# Patient Record
Sex: Male | Born: 1960 | Race: White | Hispanic: No | Marital: Married | Smoking: Never smoker
Health system: Southern US, Community
[De-identification: ages and names within clinical notes are randomized; demographics above are authoritative.]

---

## 2003-01-12 ENCOUNTER — Ambulatory Visit (HOSPITAL_COMMUNITY): Admission: RE | Admit: 2003-01-12 | Discharge: 2003-01-12 | Payer: Self-pay | Admitting: Gastroenterology

## 2003-03-30 ENCOUNTER — Ambulatory Visit (HOSPITAL_COMMUNITY): Admission: RE | Admit: 2003-03-30 | Discharge: 2003-03-30 | Payer: Self-pay | Admitting: General Surgery

## 2005-12-04 ENCOUNTER — Encounter: Admission: RE | Admit: 2005-12-04 | Discharge: 2005-12-04 | Payer: Self-pay | Admitting: Family Medicine

## 2008-12-08 ENCOUNTER — Ambulatory Visit: Payer: Self-pay | Admitting: Otolaryngology

## 2012-03-09 ENCOUNTER — Other Ambulatory Visit: Payer: Self-pay | Admitting: Advanced Practice Midwife

## 2012-03-09 DIAGNOSIS — R200 Anesthesia of skin: Secondary | ICD-10-CM

## 2012-03-09 DIAGNOSIS — M545 Low back pain: Secondary | ICD-10-CM

## 2012-03-12 ENCOUNTER — Inpatient Hospital Stay: Admission: RE | Admit: 2012-03-12 | Payer: Self-pay | Source: Ambulatory Visit

## 2012-07-05 ENCOUNTER — Ambulatory Visit
Admission: RE | Admit: 2012-07-05 | Discharge: 2012-07-05 | Disposition: A | Discharge status: Home / Self Care | Payer: Managed Care, Other (non HMO) | Source: Ambulatory Visit | Attending: Advanced Practice Midwife | Admitting: Advanced Practice Midwife

## 2012-07-05 DIAGNOSIS — M545 Low back pain: Secondary | ICD-10-CM

## 2012-07-05 DIAGNOSIS — R200 Anesthesia of skin: Secondary | ICD-10-CM

## 2014-08-23 ENCOUNTER — Ambulatory Visit: Payer: Managed Care, Other (non HMO) | Admitting: Sports Medicine

## 2014-10-17 ENCOUNTER — Encounter: Payer: Self-pay | Admitting: Sports Medicine

## 2014-10-17 ENCOUNTER — Ambulatory Visit
Admission: RE | Admit: 2014-10-17 | Discharge: 2014-10-17 | Disposition: A | Discharge status: Home / Self Care | Payer: Managed Care, Other (non HMO) | Source: Ambulatory Visit | Attending: Sports Medicine | Admitting: Sports Medicine

## 2014-10-17 ENCOUNTER — Ambulatory Visit (INDEPENDENT_AMBULATORY_CARE_PROVIDER_SITE_OTHER): Payer: Managed Care, Other (non HMO) | Admitting: Sports Medicine

## 2014-10-17 VITALS — BP 109/74 | HR 54 | Ht 71.0 in | Wt 195.0 lb

## 2014-10-17 DIAGNOSIS — M25552 Pain in left hip: Secondary | ICD-10-CM

## 2014-10-17 NOTE — Progress Notes (Signed)
   Subjective:    Patient ID: Michael Daniels, male    DOB: 05/31/1961, 54 y.o.   MRN: [id number]  HPI Michael Daniels is a 54 year old male who presents with a 6 month history of left hip pain. Michael Daniels first noticed the pain around 6 months ago. The pain is concentrated on the lateral side of his left hip. The pain only occurs when he flexes and adducts his hip (crosses his legs). The pain is a dull ache and has not limited his activity. He is still jogging and walking without pain. He has no pain during running only afterwards. He has not tried anything for his discomfort. Of note, approximately 4 years ago while backpacking in [place], Michael Daniels developed a sharp pain down the lateral aspect of his left leg. He had to drop his pack immediately. When he returned to the [place], he had an MRI that showed only mild degenerative changes in his back. Currently, he still has constant numbness over the lateral side of his thigh to his knee that is worsened while standing. He believes the numbness on the side of his leg is unrelated to his current complaint.    Review of Systems     Objective:   Physical Exam Well developed. Well nourished. No acute distress.  BP 109/74 mmHg  Pulse 54  Ht 5\' 11"  (1.803 m)  Wt 195 lb (88.451 kg)  BMI 27.21 kg/m2  Left leg: No swelling or atrophy noted. Non-tender to palpation at greater trochanter. Normal hip ROM. No pain with internal or external hip rotation. 5/5 strength with knee flexion, knee extension and hip abduction. 4/5 strength hip flexion secondary to pain.  Negative faber test.  + FADIR test;  Lacks 10 deg on left with knee to chest hip flexion.  Right leg: No swelling or atrophy noted. Non-tender to palpation. Normal hip ROM. 5/5 strength with knee flexion, knee extension, hip abduction and hip flexion.  Negative faber test  Small nodule palpated over left SI joint.    Ultrasound: Ultrasound of hip demonstrates bone spur on anterior aspect of hip.  Possible tear in superior labrum;  Possible loose body at the superior femoral acetabular interface.  On posterior view question of hip effusion - small.  XRay:  There is bilat spurring of acetabulum.  On left there is some loss of joint space superior and inferior.  Possible loose body.  Question pistol handle grip shape of Femoral head consistent with FAI.       Assessment & Plan:    [facility]. Note reviewed/edited by Dr. Enid BaasKarl Javier Gell.

## 2014-10-17 NOTE — Assessment & Plan Note (Addendum)
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   Plan: 1) X-ray to evaluate for arthritis in the hip. Will call patient with results then proceed from there about whether to refer to a hip surgeon. See results of hip XRAY  In note that is consistent with FAI.  Await formal reading.  We may need to follow up with additional testing but in any case could try conservative approach.  Running seems to cause most pain.

## 2014-11-21 ENCOUNTER — Ambulatory Visit: Payer: Managed Care, Other (non HMO) | Admitting: Sports Medicine

## 2016-04-24 ENCOUNTER — Ambulatory Visit: Payer: Managed Care, Other (non HMO) | Admitting: Sports Medicine

## 2016-05-01 ENCOUNTER — Ambulatory Visit: Payer: Managed Care, Other (non HMO) | Admitting: Sports Medicine

## 2017-01-20 IMAGING — CR DG HIP (WITH OR WITHOUT PELVIS) 2-3V*L*
2 series · 2 of 2 positions shown · non-contrast
Comparison: None.

ADDENDUM:
The original report was by Dr. Levar Forlife. The following addendum
is by Dr. Babji Alkaabi:

Dr. Gelso Kane question through the [REDACTED] EMR system for a second
opinion on this case particularly with respect to femoroacetabular
impingement.
Aspherical appearance at the junction of the femoral head and neck
bilaterally does represent a compelling appearance for cam-type
femoroacetabular impingement. There is overhanging spurring of the
right acetabulum which may also reflect associated pincer type MUTAIRI.
Mild craniocaudad loss of articular space, right greater than left.
CLINICAL DATA: Left hip pain for 3-4 months, no known injury,
initial encounter
EXAM:
DG HIP W/ PELVIS 2-3V*L*

[view not recorded (1 of 2)]
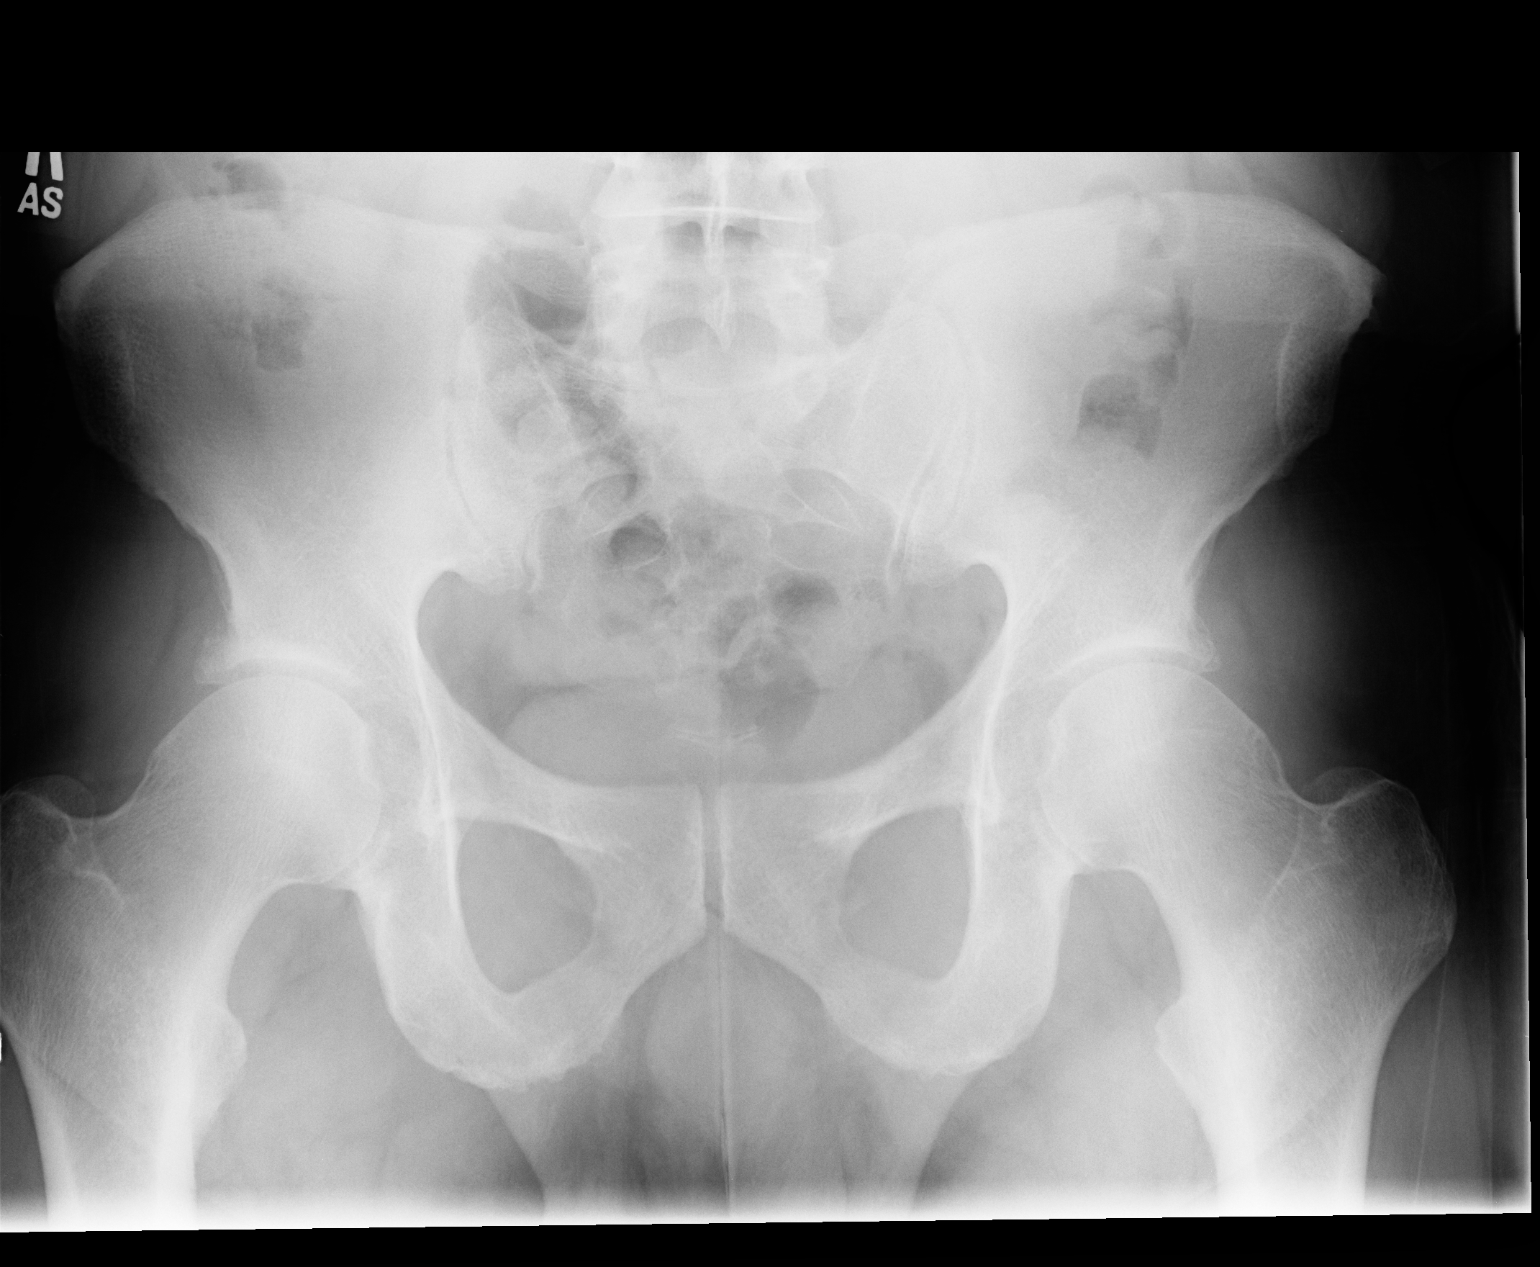

[view not recorded (2 of 2)]
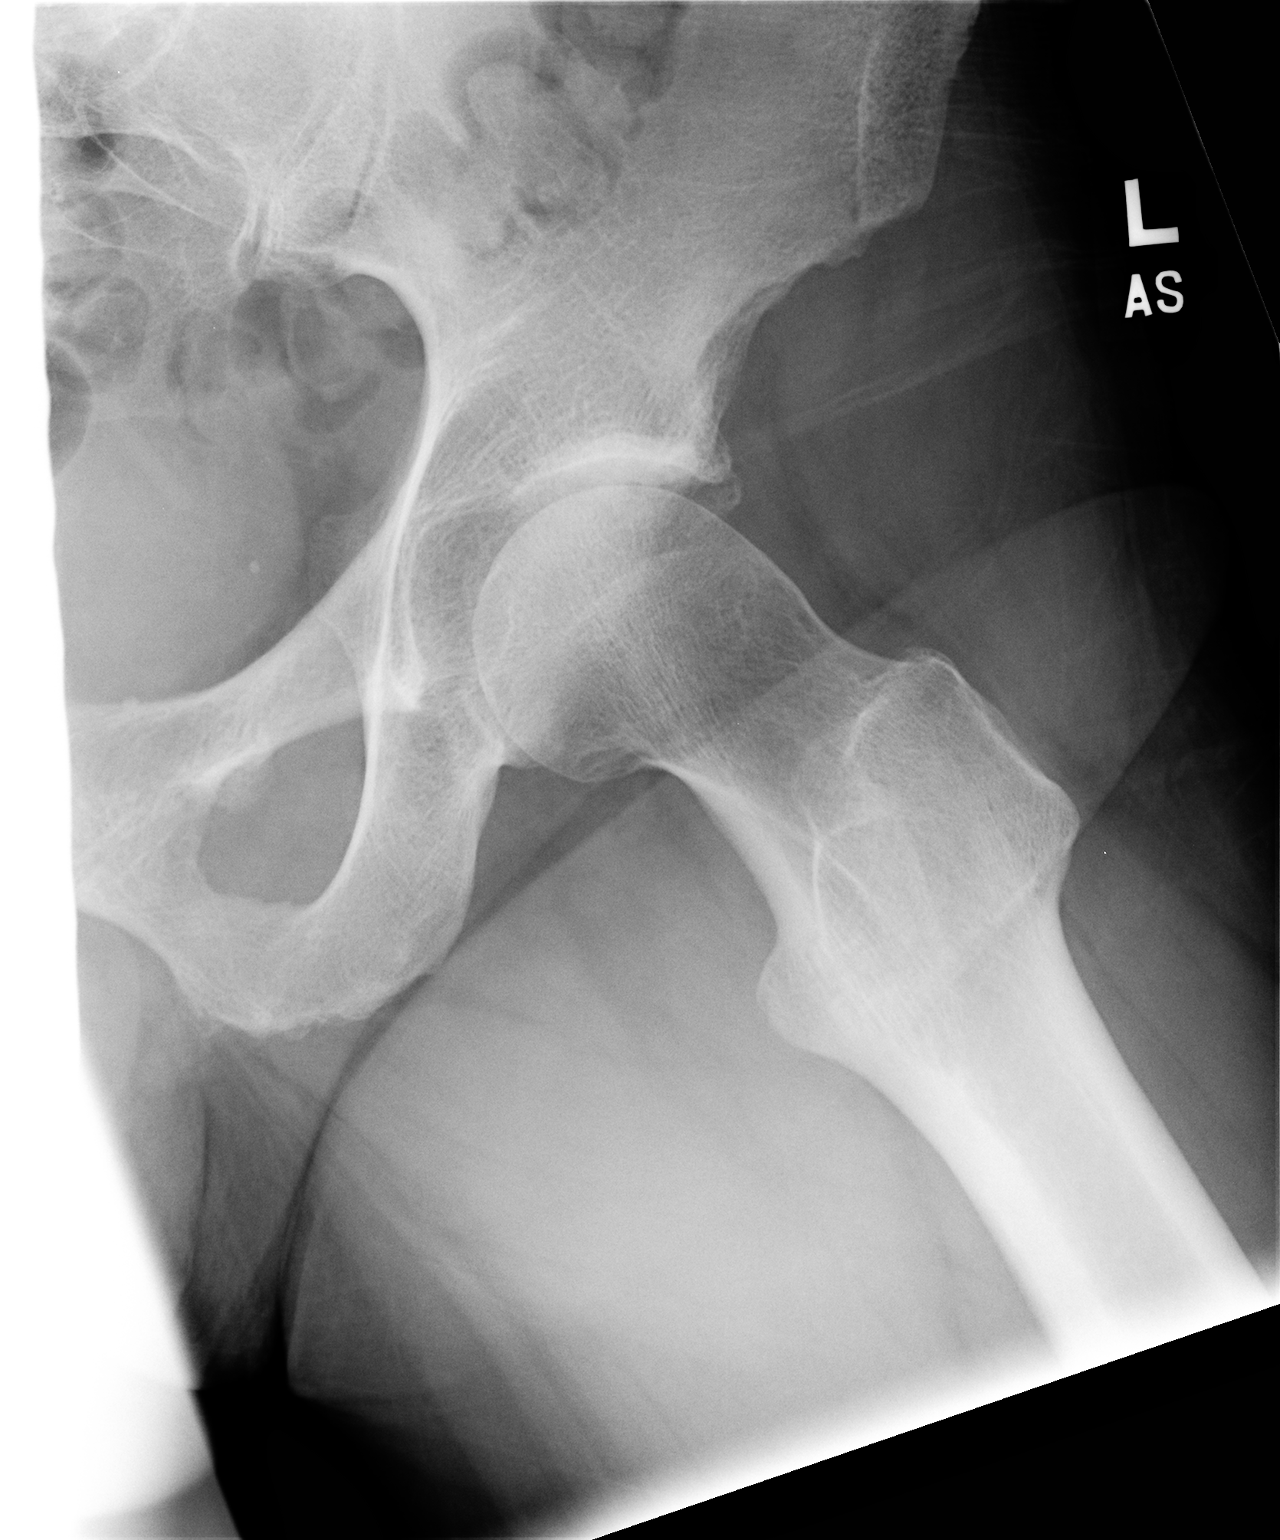

[2 of 2 positions shown; findings below may reference images not displayed]

FINDINGS: Mild degenerative changes of the hip joints are noted bilaterally.
The pelvic ring is intact. No gross soft tissue abnormality is
noted. No acute fracture is seen.
IMPRESSION: Degenerative changes without acute abnormality.

## 2017-12-03 ENCOUNTER — Ambulatory Visit: Payer: Managed Care, Other (non HMO) | Admitting: Sports Medicine

## 2019-06-30 ENCOUNTER — Other Ambulatory Visit: Payer: Self-pay

## 2019-06-30 DIAGNOSIS — Z20822 Contact with and (suspected) exposure to covid-19: Secondary | ICD-10-CM

## 2019-07-01 LAB — NOVEL CORONAVIRUS, NAA: SARS-CoV-2, NAA: NOT DETECTED

## 2019-09-27 DIAGNOSIS — M7702 Medial epicondylitis, left elbow: Secondary | ICD-10-CM | POA: Diagnosis not present

## 2019-09-27 DIAGNOSIS — M7712 Lateral epicondylitis, left elbow: Secondary | ICD-10-CM | POA: Diagnosis not present

## 2019-09-29 DIAGNOSIS — M7702 Medial epicondylitis, left elbow: Secondary | ICD-10-CM | POA: Diagnosis not present

## 2019-09-29 DIAGNOSIS — M7712 Lateral epicondylitis, left elbow: Secondary | ICD-10-CM | POA: Diagnosis not present

## 2019-10-03 DIAGNOSIS — M7702 Medial epicondylitis, left elbow: Secondary | ICD-10-CM | POA: Diagnosis not present

## 2019-10-03 DIAGNOSIS — M7712 Lateral epicondylitis, left elbow: Secondary | ICD-10-CM | POA: Diagnosis not present

## 2019-10-10 DIAGNOSIS — M7712 Lateral epicondylitis, left elbow: Secondary | ICD-10-CM | POA: Diagnosis not present

## 2019-10-10 DIAGNOSIS — M7702 Medial epicondylitis, left elbow: Secondary | ICD-10-CM | POA: Diagnosis not present

## 2019-11-11 DIAGNOSIS — M7712 Lateral epicondylitis, left elbow: Secondary | ICD-10-CM | POA: Diagnosis not present

## 2020-01-02 DIAGNOSIS — M7712 Lateral epicondylitis, left elbow: Secondary | ICD-10-CM | POA: Diagnosis not present

## 2020-01-13 DIAGNOSIS — L700 Acne vulgaris: Secondary | ICD-10-CM | POA: Diagnosis not present

## 2020-01-13 DIAGNOSIS — L810 Postinflammatory hyperpigmentation: Secondary | ICD-10-CM | POA: Diagnosis not present

## 2020-05-10 DIAGNOSIS — M7712 Lateral epicondylitis, left elbow: Secondary | ICD-10-CM | POA: Diagnosis not present

## 2020-06-07 DIAGNOSIS — H04123 Dry eye syndrome of bilateral lacrimal glands: Secondary | ICD-10-CM | POA: Diagnosis not present

## 2020-07-17 DIAGNOSIS — R590 Localized enlarged lymph nodes: Secondary | ICD-10-CM | POA: Diagnosis not present

## 2020-07-17 DIAGNOSIS — K644 Residual hemorrhoidal skin tags: Secondary | ICD-10-CM | POA: Diagnosis not present

## 2020-07-30 DIAGNOSIS — L739 Follicular disorder, unspecified: Secondary | ICD-10-CM | POA: Diagnosis not present

## 2020-07-30 DIAGNOSIS — Z23 Encounter for immunization: Secondary | ICD-10-CM | POA: Diagnosis not present

## 2020-07-31 DIAGNOSIS — M7712 Lateral epicondylitis, left elbow: Secondary | ICD-10-CM | POA: Diagnosis not present

## 2020-08-13 DIAGNOSIS — M25522 Pain in left elbow: Secondary | ICD-10-CM | POA: Diagnosis not present

## 2020-08-28 DIAGNOSIS — M7712 Lateral epicondylitis, left elbow: Secondary | ICD-10-CM | POA: Diagnosis not present

## 2020-08-30 DIAGNOSIS — K649 Unspecified hemorrhoids: Secondary | ICD-10-CM | POA: Diagnosis not present

## 2020-09-13 DIAGNOSIS — Z13220 Encounter for screening for lipoid disorders: Secondary | ICD-10-CM | POA: Diagnosis not present

## 2020-09-13 DIAGNOSIS — Z79899 Other long term (current) drug therapy: Secondary | ICD-10-CM | POA: Diagnosis not present

## 2020-09-13 DIAGNOSIS — Z0000 Encounter for general adult medical examination without abnormal findings: Secondary | ICD-10-CM | POA: Diagnosis not present

## 2020-11-02 DIAGNOSIS — K602 Anal fissure, unspecified: Secondary | ICD-10-CM | POA: Diagnosis not present

## 2020-11-02 DIAGNOSIS — L662 Folliculitis decalvans: Secondary | ICD-10-CM | POA: Diagnosis not present

## 2020-11-02 DIAGNOSIS — L219 Seborrheic dermatitis, unspecified: Secondary | ICD-10-CM | POA: Diagnosis not present

## 2020-11-02 DIAGNOSIS — L821 Other seborrheic keratosis: Secondary | ICD-10-CM | POA: Diagnosis not present

## 2020-12-24 DIAGNOSIS — L0889 Other specified local infections of the skin and subcutaneous tissue: Secondary | ICD-10-CM | POA: Diagnosis not present

## 2020-12-24 DIAGNOSIS — L739 Follicular disorder, unspecified: Secondary | ICD-10-CM | POA: Diagnosis not present

## 2020-12-24 DIAGNOSIS — L738 Other specified follicular disorders: Secondary | ICD-10-CM | POA: Diagnosis not present

## 2021-02-13 DIAGNOSIS — J309 Allergic rhinitis, unspecified: Secondary | ICD-10-CM | POA: Diagnosis not present

## 2021-03-08 DIAGNOSIS — M1711 Unilateral primary osteoarthritis, right knee: Secondary | ICD-10-CM | POA: Diagnosis not present

## 2021-03-08 DIAGNOSIS — M25561 Pain in right knee: Secondary | ICD-10-CM | POA: Diagnosis not present

## 2021-04-23 ENCOUNTER — Ambulatory Visit: Payer: 59 | Admitting: Sports Medicine

## 2021-05-23 ENCOUNTER — Ambulatory Visit: Payer: Self-pay | Admitting: Sports Medicine

## 2021-07-22 DIAGNOSIS — U071 COVID-19: Secondary | ICD-10-CM | POA: Diagnosis not present

## 2021-09-11 DIAGNOSIS — K219 Gastro-esophageal reflux disease without esophagitis: Secondary | ICD-10-CM | POA: Diagnosis not present

## 2021-09-12 DIAGNOSIS — K219 Gastro-esophageal reflux disease without esophagitis: Secondary | ICD-10-CM | POA: Diagnosis not present

## 2021-09-12 DIAGNOSIS — R1013 Epigastric pain: Secondary | ICD-10-CM | POA: Diagnosis not present

## 2021-09-12 DIAGNOSIS — R1310 Dysphagia, unspecified: Secondary | ICD-10-CM | POA: Diagnosis not present

## 2021-09-13 DIAGNOSIS — R1310 Dysphagia, unspecified: Secondary | ICD-10-CM | POA: Diagnosis not present

## 2021-09-13 DIAGNOSIS — R1013 Epigastric pain: Secondary | ICD-10-CM | POA: Diagnosis not present

## 2021-09-22 DIAGNOSIS — J019 Acute sinusitis, unspecified: Secondary | ICD-10-CM | POA: Diagnosis not present

## 2021-10-04 DIAGNOSIS — M5386 Other specified dorsopathies, lumbar region: Secondary | ICD-10-CM | POA: Diagnosis not present

## 2021-10-04 DIAGNOSIS — M9902 Segmental and somatic dysfunction of thoracic region: Secondary | ICD-10-CM | POA: Diagnosis not present

## 2021-10-04 DIAGNOSIS — M9904 Segmental and somatic dysfunction of sacral region: Secondary | ICD-10-CM | POA: Diagnosis not present

## 2021-10-04 DIAGNOSIS — M9903 Segmental and somatic dysfunction of lumbar region: Secondary | ICD-10-CM | POA: Diagnosis not present

## 2021-10-08 DIAGNOSIS — M9903 Segmental and somatic dysfunction of lumbar region: Secondary | ICD-10-CM | POA: Diagnosis not present

## 2021-10-08 DIAGNOSIS — M5386 Other specified dorsopathies, lumbar region: Secondary | ICD-10-CM | POA: Diagnosis not present

## 2021-10-08 DIAGNOSIS — M9902 Segmental and somatic dysfunction of thoracic region: Secondary | ICD-10-CM | POA: Diagnosis not present

## 2021-10-08 DIAGNOSIS — M9904 Segmental and somatic dysfunction of sacral region: Secondary | ICD-10-CM | POA: Diagnosis not present

## 2021-10-14 DIAGNOSIS — Z0000 Encounter for general adult medical examination without abnormal findings: Secondary | ICD-10-CM | POA: Diagnosis not present

## 2021-10-14 DIAGNOSIS — E7800 Pure hypercholesterolemia, unspecified: Secondary | ICD-10-CM | POA: Diagnosis not present

## 2021-10-14 DIAGNOSIS — Z125 Encounter for screening for malignant neoplasm of prostate: Secondary | ICD-10-CM | POA: Diagnosis not present

## 2021-10-14 DIAGNOSIS — Z79899 Other long term (current) drug therapy: Secondary | ICD-10-CM | POA: Diagnosis not present

## 2021-10-14 DIAGNOSIS — H6982 Other specified disorders of Eustachian tube, left ear: Secondary | ICD-10-CM | POA: Diagnosis not present

## 2021-10-14 DIAGNOSIS — H9312 Tinnitus, left ear: Secondary | ICD-10-CM | POA: Diagnosis not present

## 2021-10-14 DIAGNOSIS — R5383 Other fatigue: Secondary | ICD-10-CM | POA: Diagnosis not present

## 2021-11-14 DIAGNOSIS — K219 Gastro-esophageal reflux disease without esophagitis: Secondary | ICD-10-CM | POA: Diagnosis not present

## 2021-11-14 DIAGNOSIS — Z86010 Personal history of colonic polyps: Secondary | ICD-10-CM | POA: Diagnosis not present

## 2021-11-14 DIAGNOSIS — Z800 Family history of malignant neoplasm of digestive organs: Secondary | ICD-10-CM | POA: Diagnosis not present

## 2021-11-26 DIAGNOSIS — L738 Other specified follicular disorders: Secondary | ICD-10-CM | POA: Diagnosis not present

## 2021-11-26 DIAGNOSIS — L821 Other seborrheic keratosis: Secondary | ICD-10-CM | POA: Diagnosis not present

## 2021-11-26 DIAGNOSIS — H9012 Conductive hearing loss, unilateral, left ear, with unrestricted hearing on the contralateral side: Secondary | ICD-10-CM | POA: Diagnosis not present

## 2021-11-26 DIAGNOSIS — L0889 Other specified local infections of the skin and subcutaneous tissue: Secondary | ICD-10-CM | POA: Diagnosis not present

## 2022-02-21 DIAGNOSIS — D122 Benign neoplasm of ascending colon: Secondary | ICD-10-CM | POA: Diagnosis not present

## 2022-02-21 DIAGNOSIS — Z86010 Personal history of colonic polyps: Secondary | ICD-10-CM | POA: Diagnosis not present

## 2022-04-28 DIAGNOSIS — L738 Other specified follicular disorders: Secondary | ICD-10-CM | POA: Diagnosis not present

## 2022-04-28 DIAGNOSIS — L218 Other seborrheic dermatitis: Secondary | ICD-10-CM | POA: Diagnosis not present

## 2022-04-28 DIAGNOSIS — L820 Inflamed seborrheic keratosis: Secondary | ICD-10-CM | POA: Diagnosis not present

## 2022-08-12 ENCOUNTER — Ambulatory Visit: Payer: BC Managed Care – PPO | Admitting: Sports Medicine

## 2022-08-12 VITALS — BP 126/84 | Ht 71.0 in | Wt 195.0 lb

## 2022-08-12 DIAGNOSIS — M7671 Peroneal tendinitis, right leg: Secondary | ICD-10-CM

## 2022-08-12 MED ORDER — NITROGLYCERIN 0.2 MG/HR TD PT24: MEDICATED_PATCH | TRANSDERMAL | 1 refills | Status: AC

## 2022-08-12 NOTE — Assessment & Plan Note (Signed)
Conservative care Eversion exercise  Arch strap  NTG protocol

## 2022-08-12 NOTE — Progress Notes (Signed)
  SUBJECTIVE:   CHIEF COMPLAINT / HPI:   Mr. Michael Daniels is a 61 yo who presents with 5-week history of right foot pain.  He works as an Buyer, retail and noticed pain after he walked from one terminal to the other during the trip.  He recalls that he had a pivoting motion and felt some pain and then later that day pain worsened.  Pain is an aching feeling that is worst at the end of the day.  Sharp pain elicited with eversion of foot.  He has been taking naproxen without improvement in pain.  PERTINENT  PMH / PSH: N/A  OBJECTIVE:  BP 126/84   Ht 5\' 11"  (1.803 m)   Wt 195 lb (88.5 kg)   BMI 27.20 kg/m  Constitutional: well-appearing  MSK:  Metatarsal arch well formed bilaterally, tenderness to palpation of fifth metatarsal/cuboid, full range of motion in flexion, extension, plantarflexion.  Eversionn mildly limited due to pain. Eversion testing  reproduces some pain ; PF and inversion stretch also causes some pain at base of 5th MT  U/S right foot: No tear seen in peroneus brevis.  No notable edema present. No avulsion at insertion of peroneus brevis.  Impression; of peroneus brevis unremarkable but this location at insertion of 5th MT does reproduce pain  Ultrasound and interpretation by [place] B. Fields, MD    ASSESSMENT/PLAN:   Right lateral foot pain 2/2 to peroneal brevis tendinopathy- U/S with no tear of tendon. Nitro patch, home exercise with eversion to be completed with band Arch strap over base of 5th MT . Follow-up in 6 weeks if no improvement.  Johneisha Broaden M. Nitish Roes, D.O.  Internal Medicine Resident, PGY-2 [facility]  Pager: [phone]  I observed and examined the patient with the resident and agree with assessment and plan.  Note reviewed and modified by me. #[phone], MD

## 2022-08-12 NOTE — Patient Instructions (Signed)

## 2022-10-02 ENCOUNTER — Encounter: Payer: Self-pay | Admitting: Sports Medicine

## 2022-10-02 ENCOUNTER — Ambulatory Visit: Payer: BC Managed Care – PPO | Admitting: Sports Medicine

## 2022-10-02 VITALS — BP 114/74 | Ht 71.0 in | Wt 200.0 lb

## 2022-10-02 DIAGNOSIS — S96911A Strain of unspecified muscle and tendon at ankle and foot level, right foot, initial encounter: Secondary | ICD-10-CM | POA: Insufficient documentation

## 2022-10-02 DIAGNOSIS — M79671 Pain in right foot: Secondary | ICD-10-CM

## 2022-10-02 DIAGNOSIS — S96911D Strain of unspecified muscle and tendon at ankle and foot level, right foot, subsequent encounter: Secondary | ICD-10-CM

## 2022-10-02 NOTE — Assessment & Plan Note (Signed)
Findings on ultrasound correlate with the symptoms that have persisted despite conservative treatment measure. Presence of incomplete peroneus brevis tear with fluid and swelling evident on ultrasound.  Discussed all possible treatment options.  Will evaluate bony structures via x-ray of the right foot.  I have placed the patient in an Aircast to prevent eversion or inversion and to stabilize the foot. Utilize Arnica cream to promote healing and Voltaren gel for pain relief. Avoid aggressive or unstable cardio exercises.Follow up in 4-6 weeks, if symptoms lack improvement, may need to consider MRI and/or surgical intervention options.

## 2022-10-02 NOTE — Progress Notes (Addendum)
  SUBJECTIVE:   CHIEF COMPLAINT / HPI:  Right Foot Pain:  Patient returning for follow up of lateral right foot pain and swelling occurred early November 2023 while pivoting while walking. Pain wakes him at night and seems to have worsened since the onset.  He notices that he limps at times. 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 He has attempted using the arch strap with improvement while on but mild comfort following  use and no prolonged benefit. Eversion exercises have worsened his pain. Nitro patches caused headaches.   PERTINENT  PMH / PSH: None pertinent   OBJECTIVE:  BP 114/74   Ht 5\' 11"  (1.803 m)   Wt 200 lb (90.7 kg)   BMI 27.89 kg/m  Right Foot Exam: Pes cavus arch  TTP over attachment of peroneal brevis at fifth metatarsal base Swelling over lateral aspect of right foot compared to left  Pain with eversion, strength intact FROM of ankle    Imaging  U/S 10/02/22 right foot: Peroneus brevis incomplete tear at distal aspect with significant swelling  Ultrasound and interpretation by Peterson Ao B. Fields, MD   ASSESSMENT/PLAN:  Tear of tendon of right foot, subsequent encounter Assessment & Plan: Findings on ultrasound correlate with the symptoms that have persisted despite conservative treatment measure. Presence of incomplete peroneus brevis tear with fluid and swelling evident on ultrasound.  Discussed all possible treatment options.  Will evaluate bony structures via x-ray of the right foot.  I have placed the patient in an Aircast to prevent eversion or inversion and to stabilize the foot. Utilize Arnica cream to promote healing and Voltaren gel for pain relief. Avoid aggressive or unstable cardio exercises.Follow up in 4-6 weeks, if symptoms lack improvement, may need to consider MRI and/or surgical intervention options.    Right foot pain -     DG Foot Complete Right; Future   No follow-ups on file. Erskine Emery, MD 10/02/2022, 3:24 PM PGY-2, [place]  I observed and examined the patient with the resident and agree with assessment and plan.  Note reviewed and modified by me. Ila Mcgill, MD  Patient called and advised that foot XR was normal.  He does feel better with brace and should continue this.  KBF

## 2022-10-03 ENCOUNTER — Ambulatory Visit
Admission: RE | Admit: 2022-10-03 | Discharge: 2022-10-03 | Disposition: A | Discharge status: Home / Self Care | Payer: BC Managed Care – PPO | Source: Ambulatory Visit | Attending: Sports Medicine | Admitting: Sports Medicine

## 2022-10-03 DIAGNOSIS — M79671 Pain in right foot: Secondary | ICD-10-CM | POA: Diagnosis not present

## 2022-11-12 DIAGNOSIS — Z79899 Other long term (current) drug therapy: Secondary | ICD-10-CM | POA: Diagnosis not present

## 2022-11-12 DIAGNOSIS — Z0000 Encounter for general adult medical examination without abnormal findings: Secondary | ICD-10-CM | POA: Diagnosis not present

## 2022-11-12 DIAGNOSIS — E7800 Pure hypercholesterolemia, unspecified: Secondary | ICD-10-CM | POA: Diagnosis not present

## 2022-11-13 ENCOUNTER — Ambulatory Visit: Payer: BC Managed Care – PPO | Admitting: Sports Medicine

## 2022-11-13 VITALS — BP 122/78 | Ht 71.0 in | Wt 195.0 lb

## 2022-11-13 DIAGNOSIS — M7671 Peroneal tendinitis, right leg: Secondary | ICD-10-CM | POA: Diagnosis not present

## 2022-11-13 DIAGNOSIS — M7918 Myalgia, other site: Secondary | ICD-10-CM

## 2022-11-13 DIAGNOSIS — S96911D Strain of unspecified muscle and tendon at ankle and foot level, right foot, subsequent encounter: Secondary | ICD-10-CM

## 2022-11-13 NOTE — Assessment & Plan Note (Signed)
Provided with home piriformis exercises and stretches and advised to massage the piriformis with a tennis ball. Return as needed.

## 2022-11-13 NOTE — Assessment & Plan Note (Signed)
Tear still visible on [place] but improved. Pain much improved. Can play pickle ball but with air cast on for at least 3 months. Return as needed.

## 2022-11-13 NOTE — Progress Notes (Signed)
PCP: Gaynelle Arabian, MD  Subjective:   HPI: Patient is a 62 y.o. male here for follow up on R foot pain due to a tear of the peroneus brevis.  Last seen on 10/02/2022 and was placed in an Aircast to stabilize the foot and recommended to use Arnica cream and Voltaren gel for pain.  Today he states he has been wearing the air cast about 75% of the time. Not currently using any topical or oral pain medications, doesn't feel that he needs them as pain has significantly improved. Denies pain but admits to stiffness of the R ankle in the mornings which improves throughout the day. Has been able to exercise on the elliptical but has not tried play pickle ball which he would like to do.   Also feels like he is "sitting on a wallet" on R side. Worse with pressing the gas petal while driving.    Vitals:   11/13/22 1334  BP: 122/78       Objective:  Physical Exam:  Gen: awake, alert, NAD, comfortable in exam room Pulm: breathing unlabored  Ankle: - Inspection: No obvious deformity, erythema, swelling, or ecchymosis, ulcers, calluses, blisters - Palpation: No TTP at MT heads, no TTP at base of 5th MT, no TTP over cuboid, no tenderness over navicular prominence, no TTP over lateral or medial malleolus.  No sign of peroneal tendon subluxation or TTP. - Strength: Normal strength with dorsiflexion, plantarflexion, inversion, and eversion of foot without pain -Able to stand on lateral side of L foot without pain - ROM: Full ROM - Neuro/vasc: NV intact  Normal gait  5/5 hip abduction with knee extended and flexed.  Strong hip rotation TTP of piriformis muscle belly   [place] screen for R ankle:  - Peroneus brevis with incomplete tear, some progressive healing when compared to previous US    Assessment & Plan:   Tear of tendon of right foot Tear still visible on [place] but improved. Pain much improved. Can play pickle ball but with air cast on for at least 3 months. Return as needed.   Piriformis  muscle pain Provided with home piriformis exercises and stretches and advised to massage the piriformis with a tennis ball. Return as needed.    Precious Gilding, DO Family Medicine Resident, PGY-2  I observed and examined the patient with the resident and agree with assessment and plan.  Note reviewed and modified by me. Ila Mcgill, MD

## 2022-11-13 NOTE — Assessment & Plan Note (Signed)
Dramatically improved sxs from use of aircast splint support  We will progress his activity However, continue to use aircast for sports for 3 mos total  Reck in 6 weeks unless all sxs resolved/ clearly much better

## 2023-03-16 DIAGNOSIS — J069 Acute upper respiratory infection, unspecified: Secondary | ICD-10-CM | POA: Diagnosis not present

## 2023-03-30 DIAGNOSIS — L738 Other specified follicular disorders: Secondary | ICD-10-CM | POA: Diagnosis not present

## 2023-03-30 DIAGNOSIS — L820 Inflamed seborrheic keratosis: Secondary | ICD-10-CM | POA: Diagnosis not present

## 2023-11-24 DIAGNOSIS — Z789 Other specified health status: Secondary | ICD-10-CM | POA: Diagnosis not present

## 2023-11-24 DIAGNOSIS — Z125 Encounter for screening for malignant neoplasm of prostate: Secondary | ICD-10-CM | POA: Diagnosis not present

## 2023-11-24 DIAGNOSIS — Z0000 Encounter for general adult medical examination without abnormal findings: Secondary | ICD-10-CM | POA: Diagnosis not present

## 2023-11-24 DIAGNOSIS — Z131 Encounter for screening for diabetes mellitus: Secondary | ICD-10-CM | POA: Diagnosis not present

## 2023-11-24 DIAGNOSIS — Z6828 Body mass index (BMI) 28.0-28.9, adult: Secondary | ICD-10-CM | POA: Diagnosis not present

## 2023-11-24 DIAGNOSIS — Z1331 Encounter for screening for depression: Secondary | ICD-10-CM | POA: Diagnosis not present

## 2023-11-24 DIAGNOSIS — Z130 Encounter for screening for diseases of the blood and blood-forming organs and certain disorders involving the immune mechanism: Secondary | ICD-10-CM | POA: Diagnosis not present

## 2024-04-29 DIAGNOSIS — L662 Folliculitis decalvans: Secondary | ICD-10-CM | POA: Diagnosis not present

## 2024-06-02 ENCOUNTER — Ambulatory Visit: Admitting: Sports Medicine

## 2024-08-04 ENCOUNTER — Ambulatory Visit: Admitting: Sports Medicine

## 2024-08-04 ENCOUNTER — Encounter: Payer: Self-pay | Admitting: Sports Medicine

## 2024-08-04 ENCOUNTER — Other Ambulatory Visit: Payer: Self-pay

## 2024-08-04 VITALS — BP 118/82 | Ht 71.0 in | Wt 193.0 lb

## 2024-08-04 DIAGNOSIS — S96911D Strain of unspecified muscle and tendon at ankle and foot level, right foot, subsequent encounter: Secondary | ICD-10-CM

## 2024-08-04 DIAGNOSIS — M79671 Pain in right foot: Secondary | ICD-10-CM

## 2024-08-04 NOTE — Progress Notes (Signed)
 Established Patient Office Visit  PCP: [name], MD (Inactive)  Patient is a 63 y.o. male here for right lateral foot pain.  He describes pain on the lateral aspect of the right foot, at the base of the fifth metatarsal.  Known history of partial tear of peroneus brevis.  Last evaluated in February 2024.  He endorses some degree of discomfort since that time.  At this point pain is only present with repetitive plantarflexion.  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  Pain will last for 2-3 days after flying.  He does not have any discomfort with running or walking and is able to play pickle ball, but he occasionally has discomfort with lateral movements to the left that require him to push off with his right foot.  He has tried wearing a compression sleeve on his right foot but cannot do so while working because it impairs the mobility of his right foot.  He has also tried using nitroglycerin  patches without significant improvement.  No past medical history on file.  Current Outpatient Medications on File Prior to Visit  Medication Sig Dispense Refill   nitroGLYCERIN  (NITRODUR - DOSED IN MG/24 HR) 0.2 mg/hr patch Use 1/4 patch daily to the affected area. 30 patch 1   No current facility-administered medications on file prior to visit.    No past surgical history on file.  No Known Allergies  BP 118/82   Ht 5' 11 (1.803 m)   Wt 193 lb (87.5 kg)   BMI 26.92 kg/m       No data to display              No data to display              Objective:  Physical Exam:  Gen: NAD, comfortable in exam room  Right foot There is no obvious deformity on inspection He maintains full active range of motion of the right ankle There is tenderness to palpation at the base of the fifth metatarsal No TTP posterior to the lateral  malleolus Grossly NV intact  Limited MSK US  right foot Ultrasound evaluation of the right lateral foot shows circumferential anechoic fluid at the distal aspect of peroneus brevis near the insertion at the base of the fifth metatarsal.  There is also a small avulsion at the base of the fifth metatarsal with underlying soft tissue swelling.  Ultrasound and interpretation by Helene NOVAK. Fields, MD and Manus Fireman, MD.  Assessment and Plan:  Right lateral foot pain History of incomplete tear of peroneus brevis.  Ultrasound evaluation today redemonstrates anechoic fluid at the distal aspect of peroneus brevis but also shows an avulsion at the base of the fifth metatarsal with soft tissue swelling. He seems to reaggravate this injury with plantarflexion required to to apply the brakes on an airplane. Unfortunately bracing options are limited as they will likely impair the mobility of his foot, which he is understandably apprehensive about.  Additional treatment options were reviewed today.  He can try topical Arnica cream.  He endorses previous intolerance to nitro patches due to headaches.  We suggested trialing shockwave today as well.  See procedural documentation below.  He will tentatively plan to return for shockwave session #2 next week if he found it beneficial.  Procedure: ECSWT Indications: Base of fifth metatarsal avulsion, partial  tear of peroneus brevis   Procedure Details Consent: Risks of procedure as well as the alternatives and risks of each were explained to the patient.  Written consent for procedure obtained. Time Out: Verified patient identification, verified procedure, site was marked, verified correct patient position, medications/allergies/relevent history reviewed.  The area was cleaned with alcohol swab.     The right lateral foot was targeted for Extracorporeal shockwave therapy.    Preset: None Power Level: 60 mJ Frequency: 10 Hz Impulse/cycles: 2000 Head size: Large    Patient tolerated procedure well without immediate complications

## 2024-08-11 ENCOUNTER — Ambulatory Visit: Admitting: Family Medicine

## 2024-09-08 DIAGNOSIS — L662 Folliculitis decalvans: Secondary | ICD-10-CM | POA: Diagnosis not present

## 2024-09-08 DIAGNOSIS — M79622 Pain in left upper arm: Secondary | ICD-10-CM | POA: Diagnosis not present

## 2024-10-12 ENCOUNTER — Ambulatory Visit: Admitting: Family Medicine

## 2024-10-12 ENCOUNTER — Other Ambulatory Visit: Payer: Self-pay

## 2024-10-12 VITALS — BP 122/80 | Ht 71.0 in | Wt 195.0 lb

## 2024-10-12 DIAGNOSIS — M79602 Pain in left arm: Secondary | ICD-10-CM

## 2024-10-12 DIAGNOSIS — M79622 Pain in left upper arm: Secondary | ICD-10-CM

## 2024-10-12 NOTE — Progress Notes (Unsigned)
" ° °  PCP: [name], MD (Inactive)  Patient is a 64 y.o. male here for L armpit pain.  HPI - 2 months of armpit inflammation, seen by his PCP for this. If his symptoms didn't improve they discussed getting an ultrasound, which he would like to get today - Nothing necessarily aggravates it, hasn't bothered him enough to try anything - Hasn't taken OTC meds - Occasionally has pain that occurs on the inside of his arm  No past medical history on file.  Medications Ordered Prior to Encounter[1]  No past surgical history on file.  Allergies[2]  BP 122/80   Ht 5' 11 (1.803 m)   Wt 195 lb (88.5 kg)   BMI 27.20 kg/m       No data to display              No data to display              Objective:  Physical Exam:  Shoulder Exam Gen: NAD, comfortable in exam room Inspection: White lesion from previous mole seen on L armpit. No edema, erythema, overlying skin changes  Palpation: No TTP to palpation over armpit or shoulder ROM: FROM bilaterally Special Tests: Negative Empty and Full Can bilaterally Strength: 5/5 strength bilaterally  NVI  Assessment and Plan:   Assessment & Plan Pain in left axilla Left axilla irritation/pain likely nerve pain.  Ultrasound was performed which demonstrated no abnormalities reassuringly. - Reassurance provided - Advised monitoring for lumps, bumps, or increased pain - Follow-up as needed  Michael Melena, DO [facility]      [1]  Current Outpatient Medications on File Prior to Visit  Medication Sig Dispense Refill   nitroGLYCERIN  (NITRODUR - DOSED IN MG/24 HR) 0.2 mg/hr patch Use 1/4 patch daily to the affected area. 30 patch 1   No current facility-administered medications on file prior to visit.  [2] No Known Allergies  "
